# Patient Record
Sex: Male | Born: 2005 | Race: White | Hispanic: No | Marital: Single
Health system: Southern US, Community
[De-identification: ages and names within clinical notes are randomized; demographics above are authoritative.]

---
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
## 2023-09-21 ENCOUNTER — Emergency Department
Admission: EM | Admit: 2023-09-21 | Discharge: 2023-09-21 | Disposition: A | Discharge status: Home / Self Care | Attending: Emergency Medicine | Admitting: Emergency Medicine

## 2023-09-21 ENCOUNTER — Emergency Department

## 2023-09-21 ENCOUNTER — Other Ambulatory Visit: Payer: Self-pay

## 2023-09-21 DIAGNOSIS — S060X0A Concussion without loss of consciousness, initial encounter: Secondary | ICD-10-CM | POA: Insufficient documentation

## 2023-09-21 DIAGNOSIS — S0990XA Unspecified injury of head, initial encounter: Secondary | ICD-10-CM

## 2023-09-21 DIAGNOSIS — W01198A Fall on same level from slipping, tripping and stumbling with subsequent striking against other object, initial encounter: Secondary | ICD-10-CM | POA: Diagnosis not present

## 2023-09-21 DIAGNOSIS — M542 Cervicalgia: Secondary | ICD-10-CM | POA: Insufficient documentation

## 2023-09-21 MED ORDER — PROCHLORPERAZINE MALEATE 10 MG PO TABS: 10.0000 mg | ORAL_TABLET | Freq: Four times a day (QID) | ORAL | 0 refills | Status: AC | PRN
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
